# Patient Record
Sex: Male | Born: 1985 | Race: Black or African American | Hispanic: No | Marital: Married | Smoking: Former smoker
Health system: Southern US, Community
[De-identification: ages and names within clinical notes are randomized; demographics above are authoritative.]

## PROBLEM LIST (undated history)

## (undated) HISTORY — PX: TONSILLECTOMY AND ADENOIDECTOMY: SUR1326

---
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
## 2007-02-11 ENCOUNTER — Emergency Department (HOSPITAL_COMMUNITY): Admission: EM | Admit: 2007-02-11 | Discharge: 2007-02-11 | Payer: Self-pay | Admitting: Family Medicine

## 2007-02-18 ENCOUNTER — Emergency Department (HOSPITAL_COMMUNITY): Admission: EM | Admit: 2007-02-18 | Discharge: 2007-02-18 | Payer: Self-pay | Admitting: Emergency Medicine

## 2008-04-23 ENCOUNTER — Emergency Department (HOSPITAL_COMMUNITY): Admission: EM | Admit: 2008-04-23 | Discharge: 2008-04-23 | Payer: Self-pay | Admitting: Emergency Medicine

## 2008-09-08 IMAGING — CR DG CHEST 2V
2 series · 2 of 2 positions shown · non-contrast
Comparison: None.

CLINICAL DATA: 21-year-old, anxiety, shortness of breath, cough.
 CHEST ? 2 VIEW:

[w chest pa]
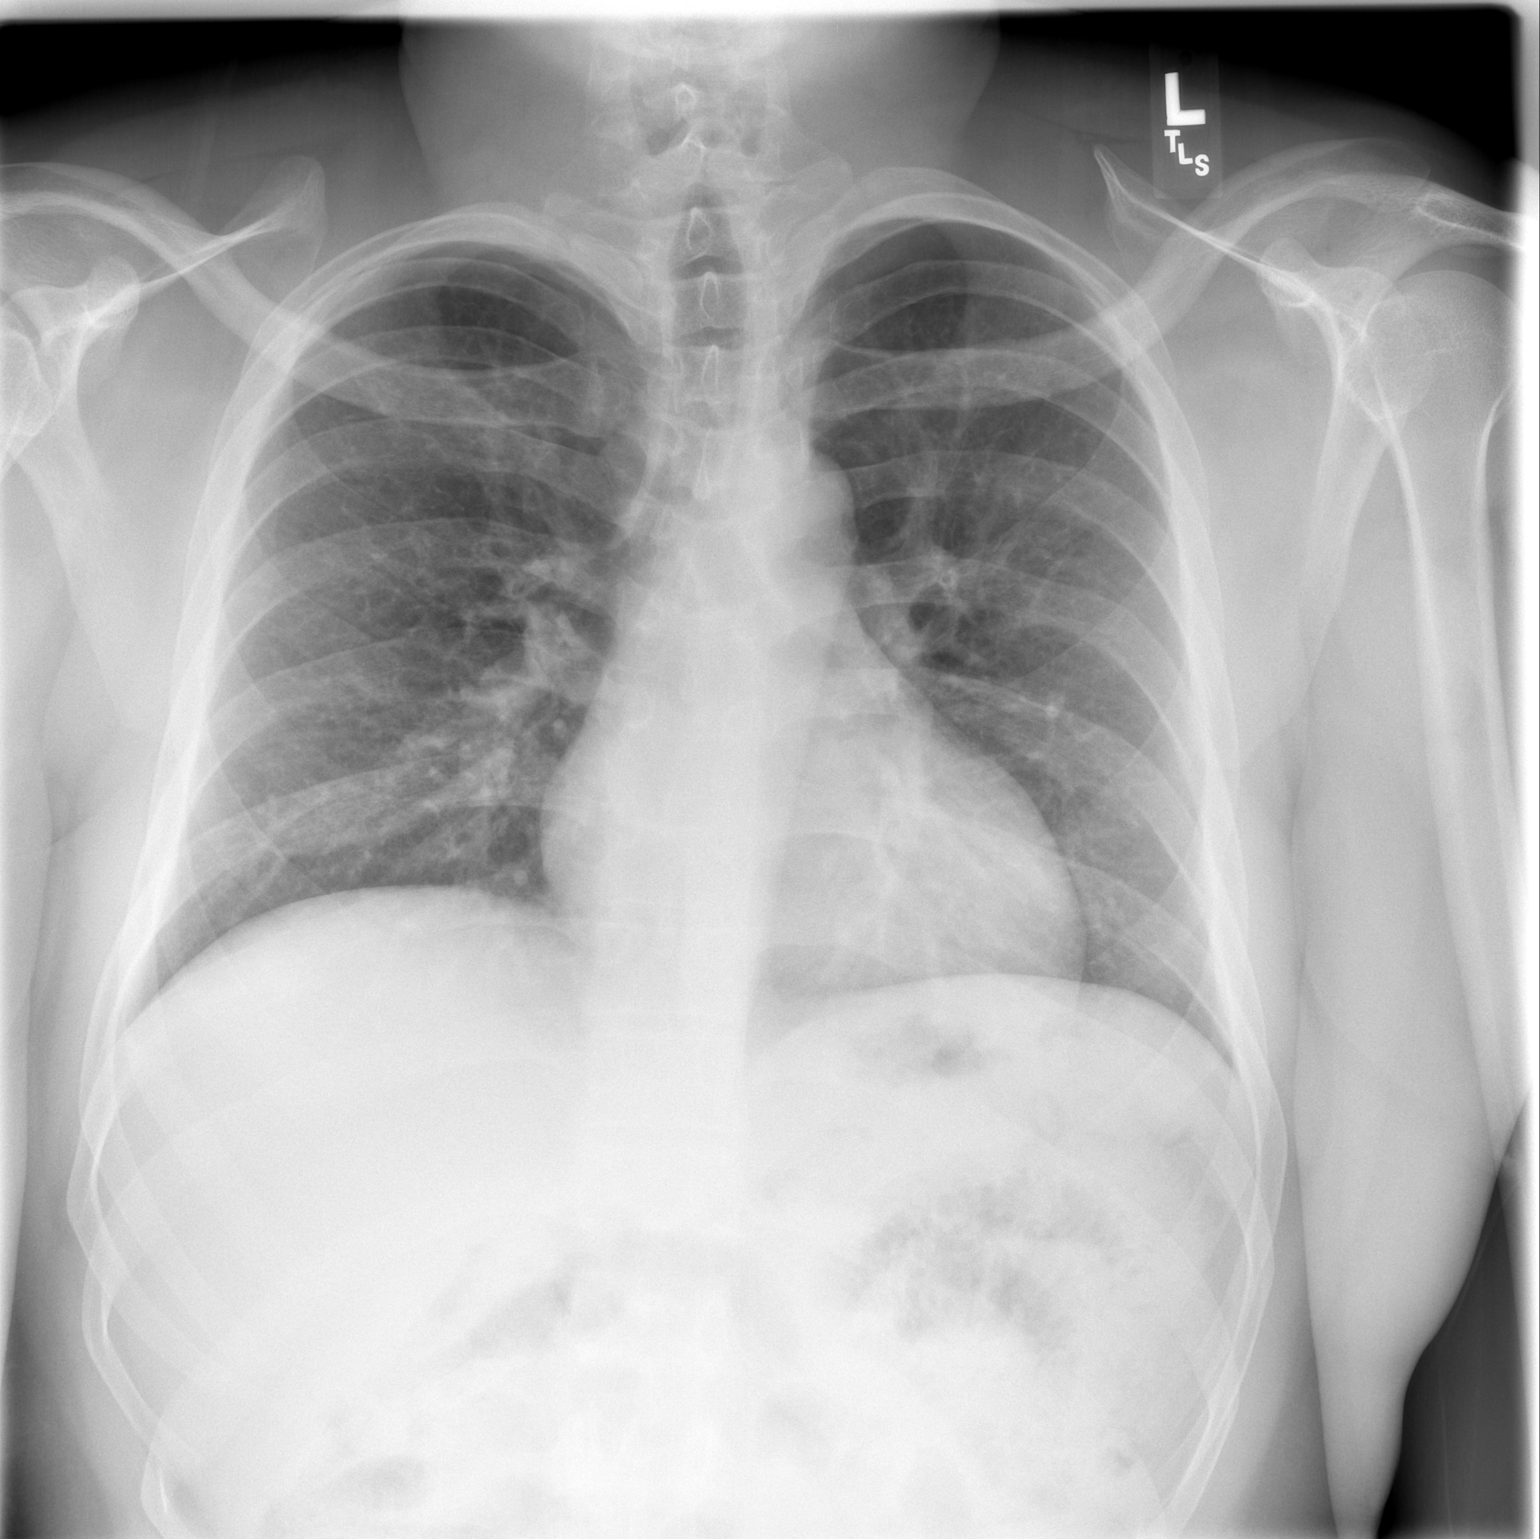

[w chest lat]
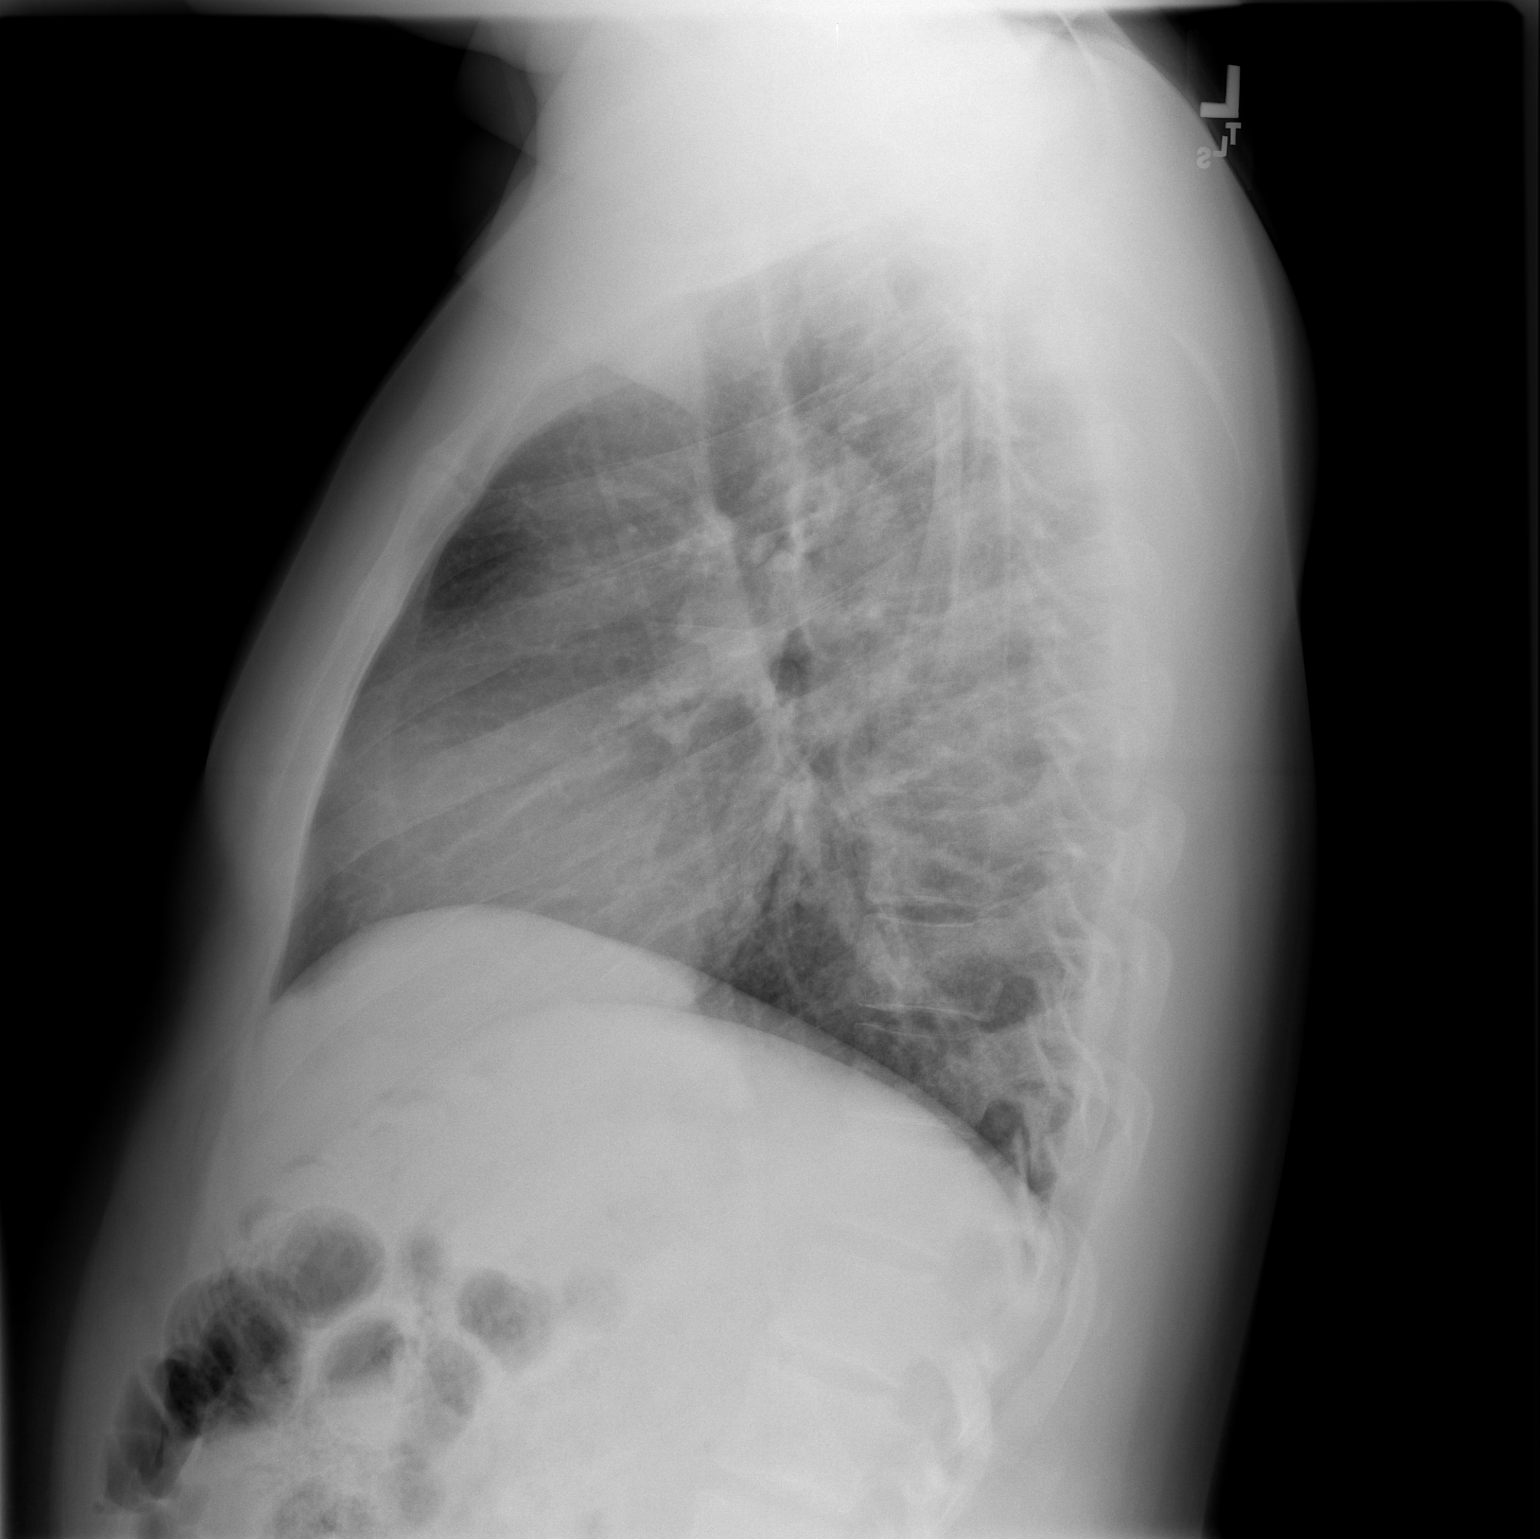

[2 of 2 positions shown; findings below may reference images not displayed]

FINDINGS: Low volume chest film with vascular crowding and streaky atelectasis.  There are bronchitic changes, likely related to smoking.  Cannot exclude superimposed bronchitis.  No focal infiltrates, edema or effusions.  Bony structures are intact.
IMPRESSION: 1. Low volume chest film with vascular crowding and atelectasis.  
 2. Bronchitic changes, likely related to smoking.

## 2010-10-22 NOTE — Op Note (Signed)
[place]  Patient:    Greg Sandoval, Greg Sandoval                       MRN: [id number] Proc. Date: 04/26/00 Adm. Date:  [id number] Disc. Date: [id number] Attending:  Fernande Boyden                           Operative Report  PREOPERATIVE DIAGNOSIS:  Right peritonsillar abscess.  POSTOPERATIVE DIAGNOSIS:  Right peritonsillar abscess.  PROCEDURE:  Incision and drainage, right peritonsillar abscess.  SURGEON:  Gloris Manchester. Lazarus Salines, M.D.  ANESTHESIA:  General orotracheal.  ESTIMATED BLOOD LOSS:  Minimal.  COMPLICATIONS:  None.  FINDINGS:  4+ tonsils with erythematous, shaggy exudate bilaterally. Asymmetric swelling of the right tonsil, including soft palate swelling and uvular edema.  A large abscess cavity behind the midpole of the tonsil on the right side contained 3-5 cc of thick, frank pus.  DESCRIPTION OF PROCEDURE:  With the patient in a comfortable supine position, general orotracheal anesthesia was induced without difficulty.  At an appropriate level, the table was turned 90 degrees and the patient placed in Trendelenburg.  A clean preparation and draping was accomplished.  Taking care to protect teeth, lips, and endotracheal tube, the Crowe-Davis mouth gag was introduced, expanded under visualization, and suspended from the Mayo stand in the standard fashion.  The findings were as described above.  Phlegm was suctioned free from the pharynx.  Using the cutting and coagulating cautery, a 2 cm crescent incision was made above the superior pole of the right tonsil and carried down through the mucosa and muscularis.  The tonsillar capsule was identified.  Using cautery tip as a blunt dissector, lysing fibrous bands and coagulating crossing vessels, the dissection was carried around the superior pole.  Finally the abscess cavity was encountered behind the posterior midpole.  This was widened with a hemostat and further opened with the  cutting and coagulating cautery.  Hemostasis was spontaneous.  The cavity was thoroughly suctioned and thoroughly irrigated as well.  Hemostasis was observed.  At this point, the mouth gag was relaxed for several minutes.  On reinspection, hemostasis was persistent.  At this point, the procedure was completed.  The mouth gag was relaxed and removed.  The dental status was intact.  The patient was returned to anesthesia, awakened, extubated, and transferred to recovery in stable condition.  COMMENT:  A 25 year old black male with a three to four week history of persistent and progressive sore throat, now more focal to the right side over the last several days and with evaluation consistent with peritonsillar abscess in the office, was indication for todays procedure.  Anticipate a routine postoperative recovery with attention to analgesia, antibiosis, and hydration. Will observe him for 23 hours, given the protracted nature of his illness and pending obvious resolution.  He understands and agrees. DD:  04/26/00 TD:  04/28/00 Job: [id number]

## 2011-03-18 LAB — DIFFERENTIAL
Basophils Absolute: 0.1
Basophils Relative: 1
Eosinophils Absolute: 0.3
Eosinophils Relative: 2
Neutrophils Relative %: 78 — ABNORMAL HIGH

## 2011-03-18 LAB — CBC
HCT: 47
MCHC: 34.2
MCV: 91.3
Platelets: 228
RDW: 12.9

## 2011-03-18 LAB — I-STAT 8, (EC8 V) (CONVERTED LAB)
Acid-base deficit: 1
Bicarbonate: 21
Chloride: 107
Operator id: 151321
TCO2: 22
pCO2, Ven: 28.7 — ABNORMAL LOW
pH, Ven: 7.473 — ABNORMAL HIGH

## 2011-03-18 LAB — POCT I-STAT CREATININE: Operator id: 151321

## 2011-03-18 LAB — RAPID STREP SCREEN (MED CTR MEBANE ONLY): Streptococcus, Group A Screen (Direct): NEGATIVE

## 2012-01-19 ENCOUNTER — Emergency Department (HOSPITAL_COMMUNITY)
Admission: EM | Admit: 2012-01-19 | Discharge: 2012-01-19 | Disposition: A | Discharge status: Home / Self Care | Payer: Self-pay | Attending: Emergency Medicine | Admitting: Emergency Medicine

## 2012-01-19 ENCOUNTER — Encounter (HOSPITAL_COMMUNITY): Payer: Self-pay | Admitting: Emergency Medicine

## 2012-01-19 DIAGNOSIS — F17200 Nicotine dependence, unspecified, uncomplicated: Secondary | ICD-10-CM | POA: Insufficient documentation

## 2012-01-19 DIAGNOSIS — R109 Unspecified abdominal pain: Secondary | ICD-10-CM | POA: Insufficient documentation

## 2012-01-19 DIAGNOSIS — R197 Diarrhea, unspecified: Secondary | ICD-10-CM | POA: Insufficient documentation

## 2012-01-19 LAB — CBC WITH DIFFERENTIAL/PLATELET
Basophils Absolute: 0 10*3/uL (ref 0.0–0.1)
Eosinophils Absolute: 0.4 10*3/uL (ref 0.0–0.7)
Eosinophils Relative: 4 % (ref 0–5)
Lymphocytes Relative: 27 % (ref 12–46)
MCH: 29.8 pg (ref 26.0–34.0)
MCV: 87.3 fL (ref 78.0–100.0)
Platelets: 270 10*3/uL (ref 150–400)
RDW: 12.9 % (ref 11.5–15.5)
WBC: 10.3 10*3/uL (ref 4.0–10.5)

## 2012-01-19 LAB — LIPASE, BLOOD: Lipase: 95 U/L — ABNORMAL HIGH (ref 11–59)

## 2012-01-19 LAB — COMPREHENSIVE METABOLIC PANEL
Albumin: 3.2 g/dL — ABNORMAL LOW (ref 3.5–5.2)
Alkaline Phosphatase: 57 U/L (ref 39–117)
BUN: 7 mg/dL (ref 6–23)
CO2: 24 mEq/L (ref 19–32)
Chloride: 105 mEq/L (ref 96–112)
GFR calc non Af Amer: >90 mL/min (ref >90)
Potassium: 3.7 mEq/L (ref 3.5–5.1)
Total Bilirubin: 0.2 mg/dL — ABNORMAL LOW (ref 0.3–1.2)

## 2012-01-19 MED ORDER — GI COCKTAIL ~~LOC~~
30.0000 mL | Freq: Once | ORAL | Status: AC
  Administered 2012-01-19: 30 mL via ORAL
  Filled 2012-01-19: qty 30

## 2012-01-19 NOTE — ED Notes (Addendum)
Pt reports abdominal pain that woke him from his sleep. 10/10 Pain has been on and off x 1 year. "Sometimes it's worse with food. Sometimes food makes it better. Pain localized to epigastric region. Tender on palpation. Denies N/V. Diarrhea x 2 episodes.

## 2012-01-19 NOTE — ED Notes (Signed)
PT. REPORTS INTERMITTENT MID ABDOMINAL PAIN FOR 2 YEARS , DIARRHEA X2 LAST NIGHT , DENIES NAUSEA OR VOMITTING , PT. STATES WIFE GOOGLED HIS SYMPTOMS AND SUSPECTS ULCER.

## 2012-01-19 NOTE — ED Provider Notes (Signed)
History     CSN: [id number]  Arrival date & time 01/19/12  0611   First MD Initiated Contact with Patient 01/19/12 [phone]      Chief Complaint  Patient presents with  . Abdominal Pain    (Consider location/radiation/quality/duration/timing/severity/associated sxs/prior treatment) HPI Comments: Patient presents with abdominal pain for the past 1-2 years. He reports intermittent abdominal pain for the past 1-2 years, the most recent episode occuring last night after eating pizza. He reports the pain started about 30 minutes after eating which is described as "gripping" in nature and located in the epigastrium. He reports associate burning in his chest. He reports moderate intensity and associated with 2 diarrhea episodes. He took tums last night which did not provide any relief. This episode is typical of all the other episodes. He admits to having a history with acid reflux and having an appointment with GI in [place], where he used to live, but did not follow up because he moved. He denies vomiting, chest pain, shortness of breath.   Patient is a 26 y.o. male presenting with abdominal pain.  Abdominal Pain The primary symptoms of the illness include abdominal pain, nausea and diarrhea. The primary symptoms of the illness do not include fever, fatigue, shortness of breath, vomiting or dysuria.  Symptoms associated with the illness do not include chills, diaphoresis, constipation or back pain.    History reviewed. No pertinent past medical history.  Past Surgical History  Procedure Date  . Tonsillectomy and adenoidectomy     History reviewed. No pertinent family history.  History  Substance Use Topics  . Smoking status: Current Everyday Smoker  . Smokeless tobacco: Not on file  . Alcohol Use: Yes      Review of Systems  Constitutional: Negative for fever, chills, diaphoresis and fatigue.  HENT: Negative for sore throat and trouble swallowing.   Respiratory: Negative for  cough, shortness of breath and wheezing.   Cardiovascular: Negative for chest pain.  Gastrointestinal: Positive for nausea, abdominal pain and diarrhea. Negative for vomiting, constipation and blood in stool.  Genitourinary: Negative for dysuria.  Musculoskeletal: Negative for back pain.  Skin: Negative for rash and wound.  Neurological: Negative for dizziness, light-headedness and headaches.    Allergies  Review of patient's allergies indicates no known allergies.  Home Medications  No current outpatient prescriptions on file.  BP 138/79  Pulse 48  Temp 97.6 F (36.4 C) (Oral)  Resp 20  SpO2 100%  Physical Exam  Nursing note and vitals reviewed. Constitutional: He is oriented to person, place, and time. He appears well-developed and well-nourished. No distress.  HENT:  Head: Normocephalic and atraumatic.  Mouth/Throat: Oropharynx is clear and moist. No oropharyngeal exudate.  Eyes: Conjunctivae are normal. Pupils are equal, round, and reactive to light. No scleral icterus.  Neck: Normal range of motion.  Cardiovascular: Regular rhythm.  Exam reveals no gallop and no friction rub.   No murmur heard.      Bradycardic at 55  Pulmonary/Chest: Effort normal and breath sounds normal. No respiratory distress. He has no wheezes. He has no rales. He exhibits no tenderness.  Abdominal: Soft. He exhibits no distension. There is tenderness. There is no rebound and no guarding.       Tender to palpation in the epigastrium and mild tenderness in the RUQ.   Musculoskeletal: Normal range of motion.  Neurological: He is alert and oriented to person, place, and time.  Skin: Skin is warm and dry. He is not  diaphoretic.  Psychiatric: He has a normal mood and affect. His behavior is normal.    ED Course  Procedures (including critical care time)   Labs Reviewed  CBC WITH DIFFERENTIAL  COMPREHENSIVE METABOLIC PANEL  LIPASE, BLOOD   No results found.   No diagnosis found.    MDM    6:50 AM Patient presents with upper abdominal pain. I will order labs to make sure his liver and pancreas are normal, which I'm anticipating. I will give him a GI cocktail in the ED and refer him to GI if labs are normal. Plan discussed with Dr. Verl Bangs and the patient who are both agreeable.  7:50 AM Patient's labs are normal and he can be discharged with follow up with GI.       Emilia Beck, PA-C 01/19/12 0750

## 2012-01-20 NOTE — ED Provider Notes (Signed)
Medical screening examination/treatment/procedure(s) were performed by non-physician practitioner and as supervising physician I was immediately available for consultation/collaboration.  Alistar Mcenery Lytle Michaels, MD 01/20/12 [phone]

## 2013-04-13 ENCOUNTER — Encounter (HOSPITAL_COMMUNITY): Payer: Self-pay | Admitting: Emergency Medicine

## 2013-04-13 ENCOUNTER — Emergency Department (HOSPITAL_COMMUNITY)
Admission: EM | Admit: 2013-04-13 | Discharge: 2013-04-13 | Disposition: A | Discharge status: Home / Self Care | Payer: Self-pay | Attending: Emergency Medicine | Admitting: Emergency Medicine

## 2013-04-13 DIAGNOSIS — S6991XA Unspecified injury of right wrist, hand and finger(s), initial encounter: Secondary | ICD-10-CM

## 2013-04-13 DIAGNOSIS — S6980XA Other specified injuries of unspecified wrist, hand and finger(s), initial encounter: Secondary | ICD-10-CM | POA: Insufficient documentation

## 2013-04-13 DIAGNOSIS — S6990XA Unspecified injury of unspecified wrist, hand and finger(s), initial encounter: Secondary | ICD-10-CM | POA: Insufficient documentation

## 2013-04-13 DIAGNOSIS — Y929 Unspecified place or not applicable: Secondary | ICD-10-CM | POA: Insufficient documentation

## 2013-04-13 DIAGNOSIS — F17200 Nicotine dependence, unspecified, uncomplicated: Secondary | ICD-10-CM | POA: Insufficient documentation

## 2013-04-13 DIAGNOSIS — Y939 Activity, unspecified: Secondary | ICD-10-CM | POA: Insufficient documentation

## 2013-04-13 DIAGNOSIS — W230XXA Caught, crushed, jammed, or pinched between moving objects, initial encounter: Secondary | ICD-10-CM | POA: Insufficient documentation

## 2013-04-13 NOTE — ED Notes (Signed)
Pt slammed his R index finger in a car door on Thursday.  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

## 2013-04-13 NOTE — ED Provider Notes (Signed)
CSN: [id number]     Arrival date & time 04/13/13  1608 History  This chart was scribed for non-physician practitioner, Johnnette Gourd, PA-C,working with Darlys Gales, MD, by Karle Plumber, ED Scribe.  This patient was seen in room TR11C/TR11C and the patient's care was started at 5:05 PM.  Chief Complaint  Patient presents with  . Finger Injury   The history is provided by the patient. No language interpreter was used.   HPI Comments:  Greg Sandoval is a 27 y.o. male who presents to the Emergency Department complaining of right index finger injury onset two days. Pt states he slammed his finger in his car door and states he is still having mild pain, but states it has resolved on its own. Pt states he just needs a note to resume normal duty at work.  History reviewed. No pertinent past medical history. Past Surgical History  Procedure Laterality Date  . Tonsillectomy and adenoidectomy     No family history on file. History  Substance Use Topics  . Smoking status: Current Every Day Smoker -- 0.50 packs/day    Types: Cigarettes  . Smokeless tobacco: Not on file  . Alcohol Use: Yes     Comment: occ    Review of Systems  A complete 10 system review of systems was obtained and all systems are negative except as noted in the HPI and PMH.   Allergies  Aspirin  Home Medications  No current outpatient prescriptions on file. Triage Vitals: BP 130/70  Pulse 77  Temp(Src) 98.6 F (37 C) (Oral)  Resp 16  Ht 6' (1.829 m)  Wt 230 lb (104.327 kg)  BMI 31.19 kg/m2  SpO2 100% Physical Exam  Nursing note and vitals reviewed. Constitutional: He is oriented to person, place, and time. He appears well-developed and well-nourished. No distress.  HENT:  Head: Normocephalic and atraumatic.  Eyes: Conjunctivae and EOM are normal.  Neck: Normal range of motion. Neck supple.  Cardiovascular: Normal rate, regular rhythm and normal heart sounds.   Pulmonary/Chest: Effort normal and breath  sounds normal.  Musculoskeletal: Normal range of motion. He exhibits no edema.  Mild tenderness to DIP of right index finger. Full ROM. No deformity or swelling. Very small bruising on nail bed. Capillary refill less than 3 seconds.   Neurological: He is alert and oriented to person, place, and time.  Skin: Skin is warm and dry.  Psychiatric: He has a normal mood and affect. His behavior is normal.    ED Course  Procedures (including critical care time) DIAGNOSTIC STUDIES: Oxygen Saturation is 100% on RA, normal by my interpretation.   COORDINATION OF CARE: 5:09 PM- Will give pt note to resume work duties. Pt verbalizes understanding and agrees to plan.  Medications - No data to display  Labs Review Labs Reviewed - No data to display Imaging Review No results found.  EKG Interpretation   None       MDM   1. Finger injury, right, initial encounter    Neurovascularly intact. Full ROM, no swelling or deformity. Note given to return to work.  I personally performed the services described in this documentation, which was scribed in my presence. The recorded information has been reviewed and is accurate.    Trevor Mace, PA-C 04/13/13 1710

## 2013-04-14 NOTE — ED Provider Notes (Signed)
Medical screening examination/treatment/procedure(s) were performed by non-physician practitioner and as supervising physician I was immediately available for consultation/collaboration.  EKG Interpretation   None         Darlys Gales, MD 04/14/13 [phone]

## 2013-11-28 ENCOUNTER — Encounter (HOSPITAL_COMMUNITY): Payer: Self-pay | Admitting: Emergency Medicine

## 2013-11-28 ENCOUNTER — Emergency Department (HOSPITAL_COMMUNITY)
Admission: EM | Admit: 2013-11-28 | Discharge: 2013-11-28 | Disposition: A | Discharge status: Home / Self Care | Payer: Self-pay | Attending: Emergency Medicine | Admitting: Emergency Medicine

## 2013-11-28 ENCOUNTER — Emergency Department (HOSPITAL_COMMUNITY): Payer: Self-pay

## 2013-11-28 DIAGNOSIS — B9789 Other viral agents as the cause of diseases classified elsewhere: Secondary | ICD-10-CM | POA: Insufficient documentation

## 2013-11-28 DIAGNOSIS — B349 Viral infection, unspecified: Secondary | ICD-10-CM

## 2013-11-28 DIAGNOSIS — E860 Dehydration: Secondary | ICD-10-CM | POA: Insufficient documentation

## 2013-11-28 DIAGNOSIS — R319 Hematuria, unspecified: Secondary | ICD-10-CM | POA: Insufficient documentation

## 2013-11-28 DIAGNOSIS — F17200 Nicotine dependence, unspecified, uncomplicated: Secondary | ICD-10-CM | POA: Insufficient documentation

## 2013-11-28 LAB — CBC WITH DIFFERENTIAL/PLATELET
Basophils Absolute: 0 10*3/uL (ref 0.0–0.1)
Basophils Relative: 0 % (ref 0–1)
EOS ABS: 0.2 10*3/uL (ref 0.0–0.7)
EOS PCT: 2 % (ref 0–5)
HEMATOCRIT: 38.2 % — AB (ref 39.0–52.0)
HEMOGLOBIN: 12.8 g/dL — AB (ref 13.0–17.0)
LYMPHS ABS: 1 10*3/uL (ref 0.7–4.0)
LYMPHS PCT: 9 % — AB (ref 12–46)
MCH: 29.7 pg (ref 26.0–34.0)
MCHC: 33.5 g/dL (ref 30.0–36.0)
MCV: 88.6 fL (ref 78.0–100.0)
MONO ABS: 0.5 10*3/uL (ref 0.1–1.0)
MONOS PCT: 4 % (ref 3–12)
Neutro Abs: 9.1 10*3/uL — ABNORMAL HIGH (ref 1.7–7.7)
Neutrophils Relative %: 85 % — ABNORMAL HIGH (ref 43–77)
Platelets: 283 10*3/uL (ref 150–400)
RBC: 4.31 MIL/uL (ref 4.22–5.81)
RDW: 12.6 % (ref 11.5–15.5)
WBC: 10.7 10*3/uL — AB (ref 4.0–10.5)

## 2013-11-28 LAB — COMPREHENSIVE METABOLIC PANEL
ALT: 15 U/L (ref 0–53)
AST: 16 U/L (ref 0–37)
Albumin: 3.6 g/dL (ref 3.5–5.2)
Alkaline Phosphatase: 61 U/L (ref 39–117)
BUN: 7 mg/dL (ref 6–23)
CALCIUM: 8.9 mg/dL (ref 8.4–10.5)
CO2: 23 meq/L (ref 19–32)
Chloride: 98 mEq/L (ref 96–112)
Creatinine, Ser: 0.83 mg/dL (ref 0.50–1.35)
GLUCOSE: 96 mg/dL (ref 70–99)
Potassium: 3.5 mEq/L — ABNORMAL LOW (ref 3.7–5.3)
SODIUM: 136 meq/L — AB (ref 137–147)
TOTAL PROTEIN: 7 g/dL (ref 6.0–8.3)
Total Bilirubin: 0.6 mg/dL (ref 0.3–1.2)

## 2013-11-28 LAB — URINALYSIS, ROUTINE W REFLEX MICROSCOPIC
BILIRUBIN URINE: NEGATIVE
Glucose, UA: NEGATIVE mg/dL
KETONES UR: NEGATIVE mg/dL
LEUKOCYTES UA: NEGATIVE
NITRITE: NEGATIVE
PH: 7 (ref 5.0–8.0)
Protein, ur: NEGATIVE mg/dL
SPECIFIC GRAVITY, URINE: 1.013 (ref 1.005–1.030)
UROBILINOGEN UA: 1 mg/dL (ref 0.0–1.0)

## 2013-11-28 LAB — URINE MICROSCOPIC-ADD ON

## 2013-11-28 MED ORDER — SODIUM CHLORIDE 0.9 % IV BOLUS (SEPSIS)
1000.0000 mL | Freq: Once | INTRAVENOUS | Status: AC
  Administered 2013-11-28: 1000 mL via INTRAVENOUS

## 2013-11-28 MED ORDER — ONDANSETRON HCL 4 MG/2ML IJ SOLN
4.0000 mg | Freq: Once | INTRAMUSCULAR | Status: DC
Start: 2013-11-28 — End: 2013-11-28

## 2013-11-28 MED ORDER — ONDANSETRON HCL 4 MG/2ML IJ SOLN
4.0000 mg | Freq: Once | INTRAMUSCULAR | Status: AC
  Administered 2013-11-28: 4 mg via INTRAVENOUS
  Filled 2013-11-28: qty 2

## 2013-11-28 MED ORDER — ACETAMINOPHEN 500 MG PO TABS
1000.0000 mg | ORAL_TABLET | Freq: Once | ORAL | Status: AC
  Administered 2013-11-28: 1000 mg via ORAL
  Filled 2013-11-28: qty 2

## 2013-11-28 NOTE — ED Notes (Signed)
MD Adriana Simasook ok with patient to go home with 100.5 F Temp. Pt has no questions. AVS in hand. Advised to drink lots of fluids and taken tylenol.

## 2013-11-28 NOTE — ED Notes (Signed)
Patient is giving urine specimen

## 2013-11-28 NOTE — ED Notes (Addendum)
Initial contact- A&O x4. Moving all extremities. C/o cough and congestion starting yesterday with emesis x10 occurences. Denies blood in vomit and denies diarrhea. Was told "I had stomach ulcers at one time." Was on vacation recently and was around family member who was coughing. Reports yellow and green sputum with cough. Took tylenol cold medicine this morning but isn't sure of time taken. Denies abdominal pain. Feels weak and dehydrated. Has had chills. No medical history. MD Adriana Simasook at bedside.

## 2013-11-28 NOTE — Progress Notes (Signed)
[facility] provided pt with a list of primary care resources and a [facility] [employer] application to help patient establish primary care.  °

## 2013-11-28 NOTE — ED Provider Notes (Signed)
CSN: [id number]     Arrival date & time 11/28/13  [id number] History   First MD Initiated Contact with Patient 11/28/13 [phone]     Chief Complaint  Patient presents with  . Cough  . Nasal Congestion  . Emesis     (Consider location/radiation/quality/duration/timing/severity/associated sxs/prior Treatment) HPI...... cough, congestion, nausea, vomiting for 24 hours. He is normally healthy. No sick contacts. "Vomiting 10 episodes in the past 24 hours".  Nothing makes symptoms better or worse. Severity is moderate.  History reviewed. No pertinent past medical history. Past Surgical History  Procedure Laterality Date  . Tonsillectomy and adenoidectomy     No family history on file. History  Substance Use Topics  . Smoking status: Current Every Day Smoker -- 0.50 packs/day    Types: Cigarettes  . Smokeless tobacco: Not on file  . Alcohol Use: Yes     Comment: occ    Review of Systems  All other systems reviewed and are negative.     Allergies  Aspirin  Home Medications   Prior to Admission medications   Medication Sig Start Date End Date Taking? Authorizing Provider  Pseudoephedrine-APAP-DM (TYLENOL COLD/FLU SEVERE DAY PO) Take 2 tablets by mouth every 8 (eight) hours as needed (for cold symstoms).   Yes Historical Provider, MD   BP 118/47  Pulse 96  Temp(Src) 101.5 F (38.6 C) (Oral)  Resp 16  SpO2 98% Physical Exam  Nursing note and vitals reviewed. Constitutional: He is oriented to person, place, and time.  Dehydrated, but nontoxic-appearing  HENT:  Head: Normocephalic and atraumatic.  Eyes: Conjunctivae and EOM are normal. Pupils are equal, round, and reactive to light.  Neck: Normal range of motion. Neck supple.  Cardiovascular: Normal rate, regular rhythm and normal heart sounds.   Pulmonary/Chest: Effort normal and breath sounds normal.  Abdominal: Soft. Bowel sounds are normal.  Musculoskeletal: Normal range of motion.  Neurological: He is alert and oriented to  person, place, and time.  Skin: Skin is warm and dry.  Psychiatric: He has a normal mood and affect. His behavior is normal.    ED Course  Procedures (including critical care time) Labs Review Labs Reviewed  CBC WITH DIFFERENTIAL - Abnormal; Notable for the following:    WBC 10.7 (*)    Hemoglobin 12.8 (*)    HCT 38.2 (*)    Neutrophils Relative % 85 (*)    Neutro Abs 9.1 (*)    Lymphocytes Relative 9 (*)    All other components within normal limits  COMPREHENSIVE METABOLIC PANEL - Abnormal; Notable for the following:    Sodium 136 (*)    Potassium 3.5 (*)    All other components within normal limits  URINALYSIS, ROUTINE W REFLEX MICROSCOPIC - Abnormal; Notable for the following:    Hgb urine dipstick MODERATE (*)    All other components within normal limits  URINE MICROSCOPIC-ADD ON    Imaging Review Dg Chest 2 View  11/28/2013   CLINICAL DATA:  Cough, congestion, nonsmoker  EXAM: CHEST  2 VIEW  COMPARISON:  02/18/2007  FINDINGS: The heart size and mediastinal contours are within normal limits. Both lungs are clear. The visualized skeletal structures are unremarkable. Lungs are hypoaerated with crowding of the bronchovascular markings.  IMPRESSION: No active cardiopulmonary disease.   Electronically Signed   By: Christiana PellantGretchen  Green M.D.   On: 11/28/2013 09:17     EKG Interpretation None      MDM   Final diagnoses:  Viral syndrome  Hematuria  History and physical consistent with viral syndrome. Chest x-ray negative. Patient feels better after IV hydration. Moderate amount of hemoglobin noted in urinalysis. Tests were discussed with the patient and his wife.  He will get a repeat urinalysis in 2-3 weeks     Donnetta HutchingBrian Yeng Frankie, MD 11/28/13 1250

## 2013-11-28 NOTE — Discharge Instructions (Signed)
Chest x-ray shows no pneumonia.   You have a small amount of blood in your urine.   This will need to be rechecked in 2-3 weeks.   Increase fluids.   Tylenol or ibuprofen for fever. Return if worse in any way.

## 2013-11-28 NOTE — ED Notes (Signed)
Per pt, states cough, congestion since yesterday and vomiting started last night

## 2014-10-31 ENCOUNTER — Emergency Department (HOSPITAL_COMMUNITY): Payer: Managed Care, Other (non HMO)

## 2014-10-31 ENCOUNTER — Emergency Department (HOSPITAL_COMMUNITY)
Admission: EM | Admit: 2014-10-31 | Discharge: 2014-10-31 | Disposition: A | Discharge status: Home / Self Care | Payer: Managed Care, Other (non HMO) | Attending: Emergency Medicine | Admitting: Emergency Medicine

## 2014-10-31 ENCOUNTER — Encounter (HOSPITAL_COMMUNITY): Payer: Self-pay | Admitting: Emergency Medicine

## 2014-10-31 DIAGNOSIS — Z87891 Personal history of nicotine dependence: Secondary | ICD-10-CM | POA: Diagnosis not present

## 2014-10-31 DIAGNOSIS — S4992XA Unspecified injury of left shoulder and upper arm, initial encounter: Secondary | ICD-10-CM | POA: Diagnosis not present

## 2014-10-31 DIAGNOSIS — Y9389 Activity, other specified: Secondary | ICD-10-CM | POA: Diagnosis not present

## 2014-10-31 DIAGNOSIS — X58XXXA Exposure to other specified factors, initial encounter: Secondary | ICD-10-CM | POA: Diagnosis not present

## 2014-10-31 DIAGNOSIS — Y990 Civilian activity done for income or pay: Secondary | ICD-10-CM | POA: Diagnosis not present

## 2014-10-31 DIAGNOSIS — S29092A Other injury of muscle and tendon of back wall of thorax, initial encounter: Secondary | ICD-10-CM | POA: Diagnosis present

## 2014-10-31 DIAGNOSIS — S29012A Strain of muscle and tendon of back wall of thorax, initial encounter: Secondary | ICD-10-CM | POA: Insufficient documentation

## 2014-10-31 DIAGNOSIS — M549 Dorsalgia, unspecified: Secondary | ICD-10-CM

## 2014-10-31 DIAGNOSIS — Y9289 Other specified places as the place of occurrence of the external cause: Secondary | ICD-10-CM | POA: Insufficient documentation

## 2014-10-31 MED ORDER — NAPROXEN 500 MG PO TABS
500.0000 mg | ORAL_TABLET | Freq: Two times a day (BID) | ORAL | Status: AC
Start: 2014-10-31 — End: ?

## 2014-10-31 MED ORDER — DIAZEPAM 5 MG PO TABS: 5.0000 mg | ORAL_TABLET | Freq: Four times a day (QID) | ORAL | Status: DC | PRN

## 2014-10-31 NOTE — ED Notes (Signed)
Pt c/o back pain onset yesterday while moving some things in storage. Pt ambulatory in triage.

## 2014-10-31 NOTE — ED Provider Notes (Signed)
CSN: [id number]     Arrival date & time 10/31/14  0850 History  This chart was scribed for Jaynie Crumble, PA-C working with Glynn Octave, MD by Elveria Rising, ED Scribe. This patient was seen in room TR10C/TR10C and the patient's care was started at 9:47 AM.   Chief Complaint  Patient presents with  . Back Pain   The history is provided by the patient. No language interpreter was used.   HPI Comments: Greg Sandoval is a 29 y.o. male who presents to the Emergency Department complaining of acute onset back pain after heavy lifting yesterday. Patient reports that he was lifting a box overhead, twisted. Patient reports feeling a stretching sensation and pop with development of pain just minutes later. Patient locates pain to left lateral thoracic back and reports positional exacerbation of this pain. Patient reports frequent heavy lifting at work and states that this task was not unusual.   History reviewed. No pertinent past medical history. Past Surgical History  Procedure Laterality Date  . Tonsillectomy and adenoidectomy     No family history on file. History  Substance Use Topics  . Smoking status: Former Smoker -- 0.50 packs/day    Types: Cigarettes  . Smokeless tobacco: Not on file  . Alcohol Use: Yes     Comment: occ    Review of Systems  Constitutional: Negative for fever.  Musculoskeletal: Positive for back pain.  Skin: Negative for color change.      Allergies  Aspirin  Home Medications   Prior to Admission medications   Medication Sig Start Date End Date Taking? Authorizing Provider  Pseudoephedrine-APAP-DM (TYLENOL COLD/FLU SEVERE DAY PO) Take 2 tablets by mouth every 8 (eight) hours as needed (for cold symstoms).    Historical Provider, MD   Triage Vitals: BP 129/80 mmHg  Pulse 57  Temp(Src) 97.9 F (36.6 C) (Oral)  Resp 18  Ht 6' (1.829 m)  Wt 223 lb (101.152 kg)  BMI 30.24 kg/m2  SpO2 98% Physical Exam  Constitutional: He is oriented to  person, place, and time. He appears well-developed and well-nourished. No distress.  HENT:  Head: Normocephalic and atraumatic.  Eyes: EOM are normal.  Neck: Neck supple. No tracheal deviation present.  Cardiovascular: Normal rate.   Pulmonary/Chest: Effort normal. No respiratory distress.  Musculoskeletal: Normal range of motion.  Tender to palpation over left periscapular area and over the lower posterior ribs. No midline thoracic or lumbar spine tenderness. Pain with range of motion of the right arm. Pain with twisting to the right of the torso. No pain with bilateral straight leg raise.  Neurological: He is alert and oriented to person, place, and time.  Skin: Skin is warm and dry.  Psychiatric: He has a normal mood and affect. His behavior is normal.  Nursing note and vitals reviewed.   ED Course  Procedures (including critical care time)  COORDINATION OF CARE: 9:52 AM- Plans to obtain imaging. PDiscussed treatment plan with patient's parent at bedside and parent agreed to plan.   Labs Review Labs Reviewed - No data to display  Imaging Review Dg Ribs Unilateral W/chest Left  10/31/2014   CLINICAL DATA:  Posterior left back pain after lifting boxes yesterday.  EXAM: LEFT RIBS AND CHEST - 3+ VIEW  COMPARISON:  Chest radiographs 11/28/2013.  FINDINGS: The heart size and mediastinal contours are normal. The lungs are clear. There is no pleural effusion or pneumothorax. Metallic BB was placed inferiorly over the area of pain. No evidence of rib  fracture or focal rib lesion.  IMPRESSION: No evidence of left-sided rib fracture, pleural effusion or pneumothorax.   Electronically Signed   By: Carey BullocksWilliam  Veazey M.D.   On: 10/31/2014 10:58     EKG Interpretation None      MDM   Final diagnoses:  Back pain  Muscle strain of left upper back, initial encounter    Patient with left periscapular pain after twisting mechanism of injury. He heard a pop. Will get a left rib x-ray to rule out  rib fracture versus dislocation. No midline thoracic or lumbar spine tenderness.   11:24 AM X-rays negative. Plan to discharge home with naproxen, Valium for spasms. Most likely muscular strain from twisting mechanism. He is neurovascularly intact. Plan to discharge home with close outpatient follow-up. Return precautions discussed.  Filed Vitals:   10/31/14 0854 10/31/14 1119  BP: 129/80 123/77  Pulse: 57 50  Temp: 97.9 F (36.6 C)   TempSrc: Oral   Resp: 18 16  Height: 6' (1.829 m)   Weight: 223 lb (101.152 kg)   SpO2: 98% 100%     I personally performed the services described in this documentation, which was scribed in my presence. The recorded information has been reviewed and is accurate.   Jaynie Crumbleatyana Ramari Bray, PA-C 10/31/14 1125  Glynn OctaveStephen Rancour, MD 10/31/14 1755

## 2014-10-31 NOTE — Discharge Instructions (Signed)
Naprosyn for pain and inflammation. Valium for muscle spasms. Follow with primary care doctor if pain not improving in about a week. Try heat and ice. No heavy lifting.  Muscle Strain A muscle strain is an injury that occurs when a muscle is stretched beyond its normal length. Usually a small number of muscle fibers are torn when this happens. Muscle strain is rated in degrees. First-degree strains have the least amount of muscle fiber tearing and pain. Second-degree and third-degree strains have increasingly more tearing and pain.  Usually, recovery from muscle strain takes 1-2 weeks. Complete healing takes 5-6 weeks.  CAUSES  Muscle strain happens when a sudden, violent force placed on a muscle stretches it too far. This may occur with lifting, sports, or a fall.  RISK FACTORS Muscle strain is especially common in athletes.  SIGNS AND SYMPTOMS At the site of the muscle strain, there may be:  Pain.  Bruising.  Swelling.  Difficulty using the muscle due to pain or lack of normal function. DIAGNOSIS  Your health care provider will perform a physical exam and ask about your medical history. TREATMENT  Often, the best treatment for a muscle strain is resting, icing, and applying cold compresses to the injured area.  HOME CARE INSTRUCTIONS   Use the PRICE method of treatment to promote muscle healing during the first 2-3 days after your injury. The PRICE method involves:  Protecting the muscle from being injured again.  Restricting your activity and resting the injured body part.  Icing your injury. To do this, put ice in a plastic bag. Place a towel between your skin and the bag. Then, apply the ice and leave it on from 15-20 minutes each hour. After the third day, switch to moist heat packs.  Apply compression to the injured area with a splint or elastic bandage. Be careful not to wrap it too tightly. This may interfere with blood circulation or increase swelling.  Elevate the  injured body part above the level of your heart as often as you can.  Only take over-the-counter or prescription medicines for pain, discomfort, or fever as directed by your health care provider.  Warming up prior to exercise helps to prevent future muscle strains. SEEK MEDICAL CARE IF:   You have increasing pain or swelling in the injured area.  You have numbness, tingling, or a significant loss of strength in the injured area. MAKE SURE YOU:   Understand these instructions.  Will watch your condition.  Will get help right away if you are not doing well or get worse. Document Released: 05/23/2005 Document Revised: 03/13/2013 Document Reviewed: 12/20/2012 [facility] Patient Information 2015 [employer], [place]. This information is not intended to replace advice given to you by your health care provider. Make sure you discuss any questions you have with your health care provider.

## 2014-10-31 NOTE — ED Notes (Signed)
Declined W/C at D/C and was escorted to lobby by RN. 

## 2014-10-31 NOTE — ED Notes (Signed)
Pt states he was lifting something at work yesterday, had sudden mid-back pain which continues today.

## 2016-05-21 IMAGING — CR DG RIBS W/ CHEST 3+V*L*
4 series · 4 of 4 positions shown · non-contrast
Comparison: Chest radiographs 11/28/2013.

CLINICAL DATA: Posterior left back pain after lifting boxes
yesterday.

EXAM:
LEFT RIBS AND CHEST - 3+ VIEW

[chest pa]
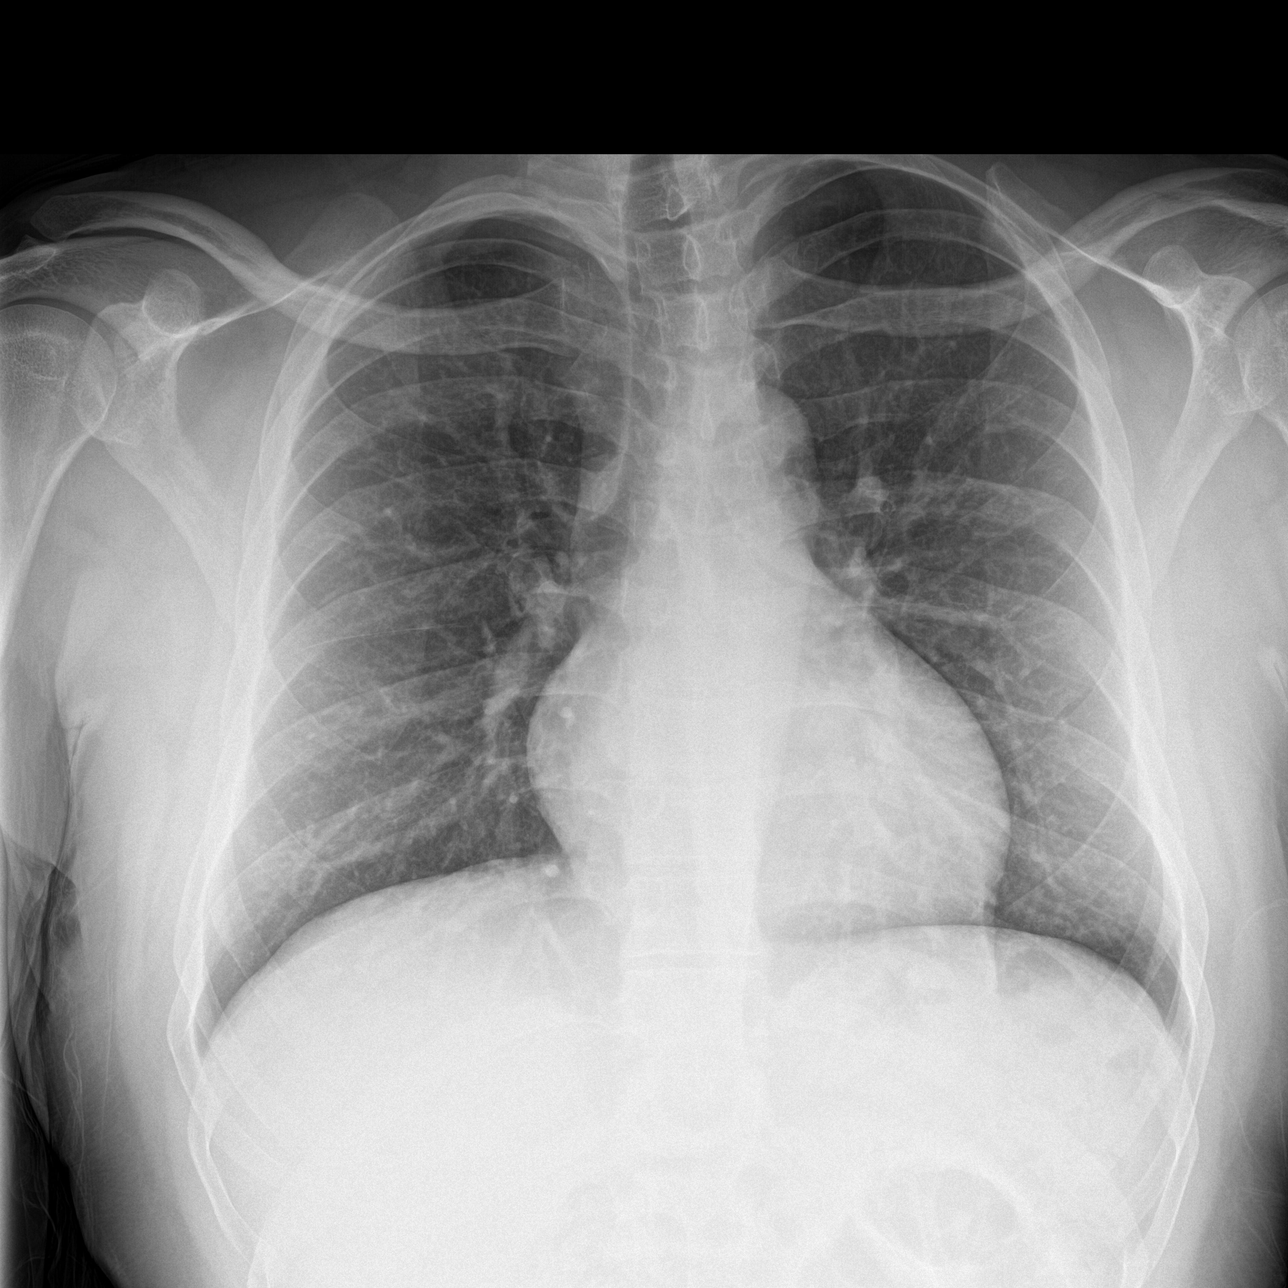

[rib pa obl (1 of 2)]
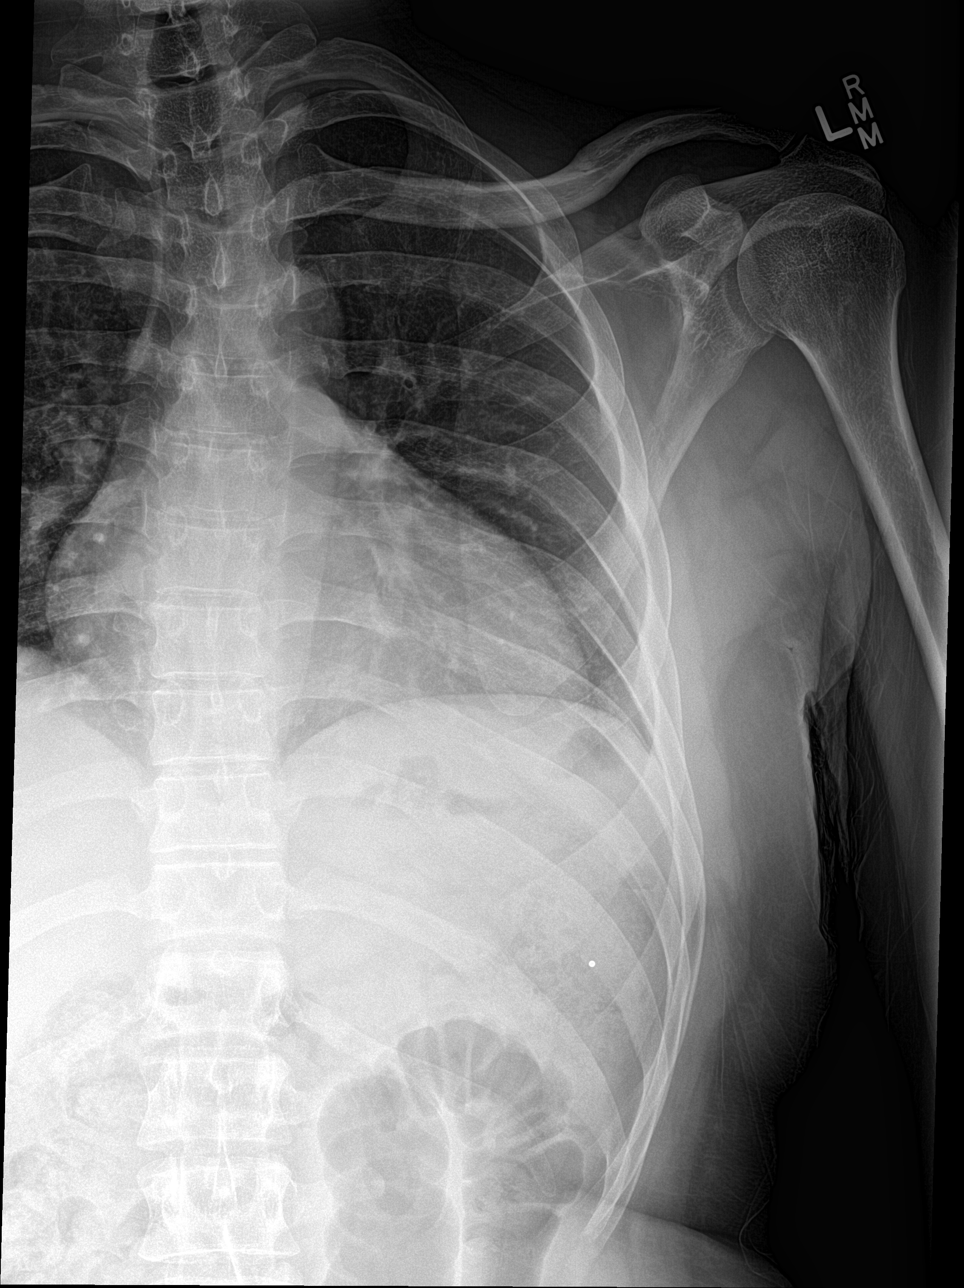

[rib pa obl (2 of 2)]
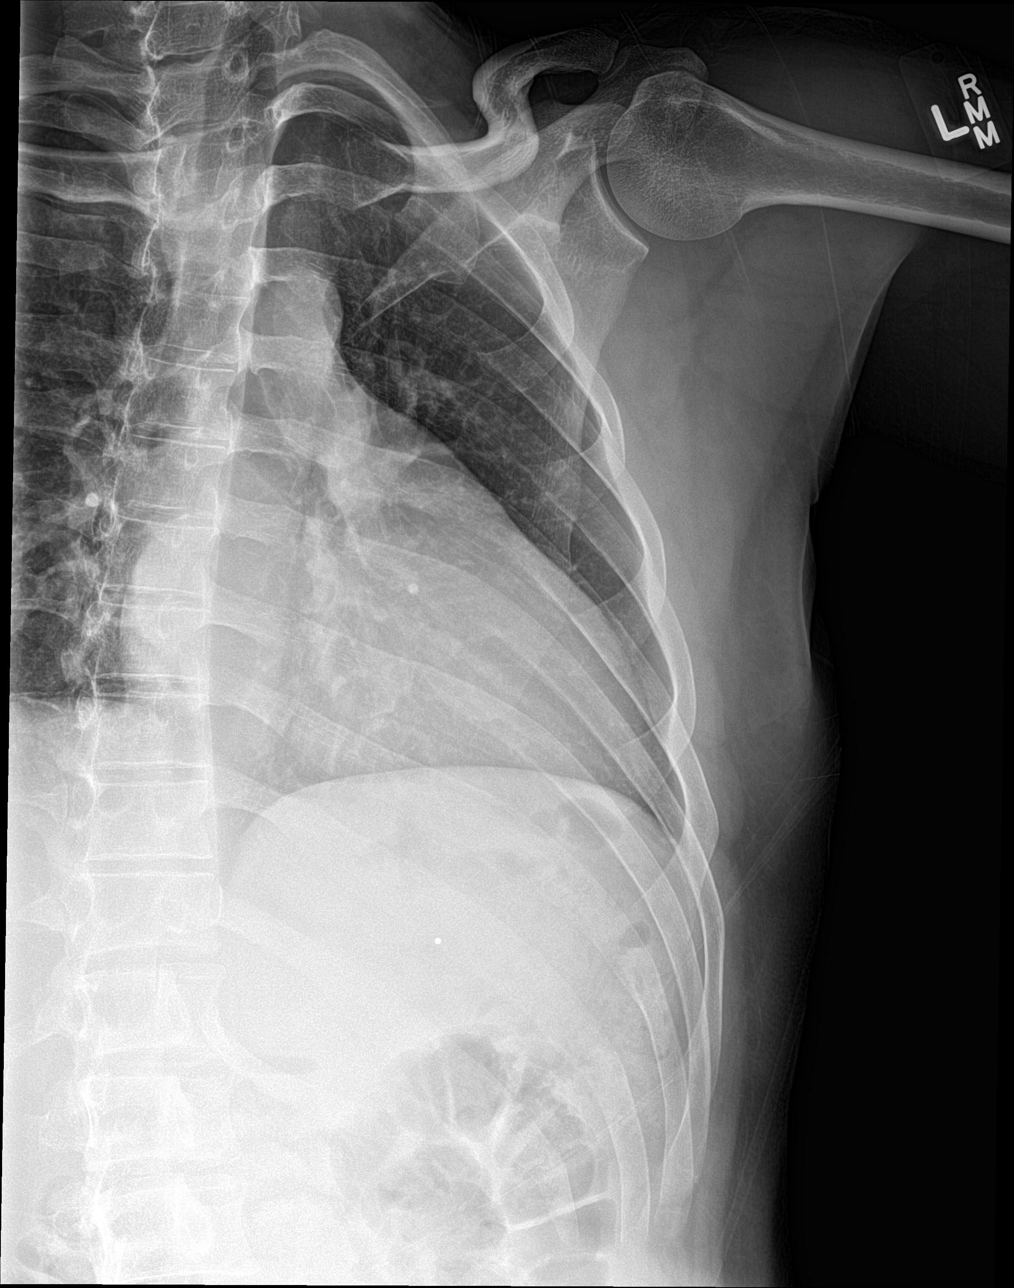

[rib pa]
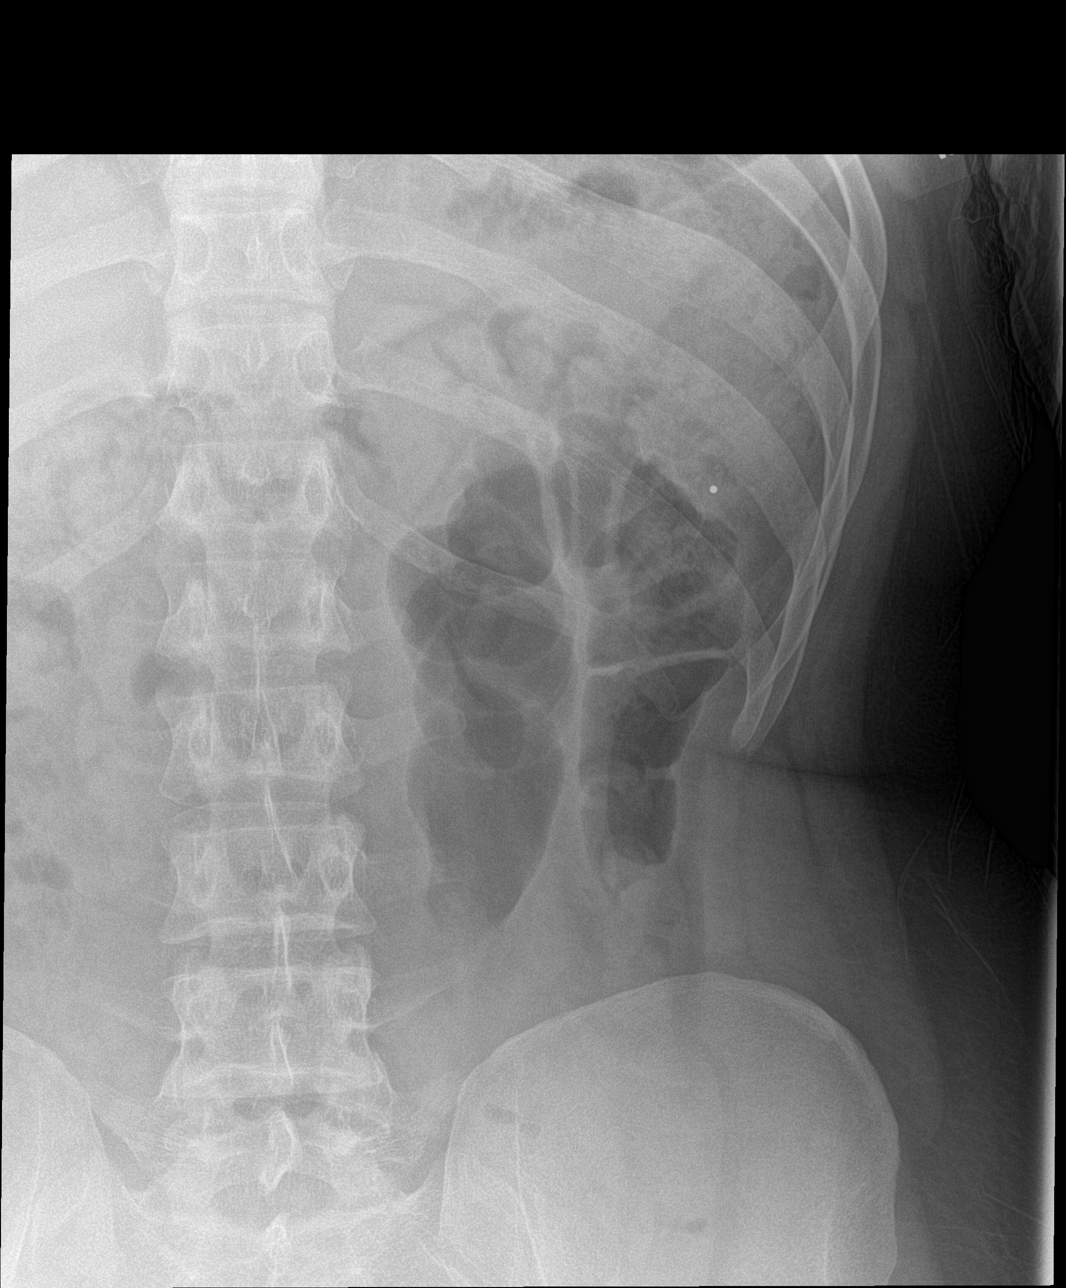

[4 of 4 positions shown; findings below may reference images not displayed]

FINDINGS: The heart size and mediastinal contours are normal. The lungs are
clear. There is no pleural effusion or pneumothorax. Metallic BB was
placed inferiorly over the area of pain. No evidence of rib fracture
or focal rib lesion.
IMPRESSION: No evidence of left-sided rib fracture, pleural effusion or
pneumothorax.

## 2023-03-08 ENCOUNTER — Other Ambulatory Visit: Payer: Self-pay | Admitting: Physician Assistant

## 2023-03-08 DIAGNOSIS — S39012D Strain of muscle, fascia and tendon of lower back, subsequent encounter: Secondary | ICD-10-CM

## 2023-03-31 ENCOUNTER — Ambulatory Visit
Admission: RE | Admit: 2023-03-31 | Discharge: 2023-03-31 | Disposition: A | Discharge status: Home / Self Care | Payer: Worker's Compensation | Source: Ambulatory Visit | Attending: Physician Assistant

## 2023-03-31 DIAGNOSIS — S39012D Strain of muscle, fascia and tendon of lower back, subsequent encounter: Secondary | ICD-10-CM

## 2024-05-17 ENCOUNTER — Other Ambulatory Visit: Payer: Self-pay

## 2024-05-17 ENCOUNTER — Emergency Department (HOSPITAL_BASED_OUTPATIENT_CLINIC_OR_DEPARTMENT_OTHER)
Admission: EM | Admit: 2024-05-17 | Discharge: 2024-05-17 | Disposition: A | Discharge status: Home / Self Care | Payer: Worker's Compensation | Attending: Emergency Medicine | Admitting: Emergency Medicine

## 2024-05-17 DIAGNOSIS — X501XXA Overexertion from prolonged static or awkward postures, initial encounter: Secondary | ICD-10-CM | POA: Insufficient documentation

## 2024-05-17 DIAGNOSIS — Y990 Civilian activity done for income or pay: Secondary | ICD-10-CM | POA: Insufficient documentation

## 2024-05-17 DIAGNOSIS — M5441 Lumbago with sciatica, right side: Secondary | ICD-10-CM | POA: Insufficient documentation

## 2024-05-17 MED ORDER — METHOCARBAMOL 500 MG PO TABS: 500.0000 mg | ORAL_TABLET | Freq: Two times a day (BID) | ORAL | 0 refills | Status: AC

## 2024-05-17 MED ORDER — KETOROLAC TROMETHAMINE 30 MG/ML IJ SOLN
30.0000 mg | Freq: Once | INTRAMUSCULAR | Status: AC
  Administered 2024-05-17: 30 mg via INTRAMUSCULAR
  Filled 2024-05-17: qty 1

## 2024-05-17 MED ORDER — NAPROXEN 500 MG PO TABS: 500.0000 mg | ORAL_TABLET | Freq: Two times a day (BID) | ORAL | 0 refills | Status: AC

## 2024-05-17 NOTE — ED Triage Notes (Signed)
 Pt POV reporting lower back pain after bending over wrong at work, hx bulging disc.

## 2024-05-17 NOTE — ED Provider Notes (Signed)
 [place] [facility] AT [facility]  Provider Note  CSN: [id number] Arrival date & time: 05/17/24 0051  History Chief Complaint  Patient presents with   Back Pain    Greg Sandoval is a 38 y.o. male reports he was bending over at work tonight when he had sudden pain in R lower back, some radiation down the R leg, but no numbness, weakness or bowel/bladder incontinence. He reports a similar episode earlier in the year that required several weeks of PT before he was able to return to work. He states workup at that time showed a bulging disk.    Home Medications Prior to Admission medications  Medication Sig Start Date End Date Taking? Authorizing Provider  methocarbamol (ROBAXIN) 500 MG tablet Take 1 tablet (500 mg total) by mouth 2 (two) times daily. 05/17/24  Yes Roselyn Carlin NOVAK, MD  naproxen  (NAPROSYN ) 500 MG tablet Take 1 tablet (500 mg total) by mouth 2 (two) times daily. 05/17/24  Yes Roselyn Carlin NOVAK, MD     Allergies    Aspirin   Review of Systems   Review of Systems Please see HPI for pertinent positives and negatives  Physical Exam BP 106/76 (BP Location: Right Arm)   Pulse 73   Temp 98.6 F (37 C)   Resp 16   Ht 6' (1.829 m)   Wt 122.5 kg   SpO2 99%   BMI 36.62 kg/m   Physical Exam Vitals and nursing note reviewed.  HENT:     Head: Normocephalic.     Nose: Nose normal.  Eyes:     Extraocular Movements: Extraocular movements intact.  Pulmonary:     Effort: Pulmonary effort is normal.  Musculoskeletal:        General: Tenderness (R lumbar paraspinal muscles) present. No swelling or deformity. Normal range of motion.     Cervical back: Neck supple.  Skin:    Findings: No rash (on exposed skin).  Neurological:     Mental Status: He is alert and oriented to person, place, and time.     Sensory: No sensory deficit.     Motor: No weakness.     Deep Tendon Reflexes: Reflexes normal.  Psychiatric:        Mood and Affect: Mood  normal.     ED Results / Procedures / Treatments   EKG None  Procedures Procedures  Medications Ordered in the ED Medications  ketorolac (TORADOL) 30 MG/ML injection 30 mg (has no administration in time range)    Initial Impression and Plan  Patient here with R lower back pain, some mild radicular symptoms, but no red flags. Plan toradol for pain here. Naproxyn, robaxin, rest, heat and follow up through worker's comp department at work. RTED for any other concerns  ED Course       MDM Rules/Calculators/A&P Medical Decision Making Problems Addressed: Acute right-sided low back pain with right-sided sciatica: acute illness or injury  Risk Prescription drug management.     Final Clinical Impression(s) / ED Diagnoses Final diagnoses:  Acute right-sided low back pain with right-sided sciatica    Rx / DC Orders ED Discharge Orders          Ordered    naproxen  (NAPROSYN ) 500 MG tablet  2 times daily        05/17/24 0234    methocarbamol (ROBAXIN) 500 MG tablet  2 times daily        05/17/24 0234  Roselyn Carlin NOVAK, MD 05/17/24 [phone]
